# Patient Record
Sex: Female | Born: 1985 | Race: Black or African American | Hispanic: No | Marital: Single | Smoking: Never smoker
Health system: Southern US, Community
[De-identification: ages and names within clinical notes are randomized; demographics above are authoritative.]

---

## 2008-06-22 ENCOUNTER — Emergency Department (HOSPITAL_COMMUNITY): Admission: EM | Admit: 2008-06-22 | Discharge: 2008-06-22 | Payer: Self-pay | Admitting: Emergency Medicine

## 2008-07-13 ENCOUNTER — Emergency Department (HOSPITAL_COMMUNITY): Admission: EM | Admit: 2008-07-13 | Discharge: 2008-07-13 | Payer: Self-pay | Admitting: Emergency Medicine

## 2011-12-12 ENCOUNTER — Emergency Department (HOSPITAL_BASED_OUTPATIENT_CLINIC_OR_DEPARTMENT_OTHER)
Admission: EM | Admit: 2011-12-12 | Discharge: 2011-12-12 | Disposition: A | Discharge status: Home / Self Care | Payer: Self-pay | Attending: Emergency Medicine | Admitting: Emergency Medicine

## 2011-12-12 ENCOUNTER — Encounter (HOSPITAL_BASED_OUTPATIENT_CLINIC_OR_DEPARTMENT_OTHER): Payer: Self-pay

## 2011-12-12 DIAGNOSIS — K1379 Other lesions of oral mucosa: Secondary | ICD-10-CM

## 2011-12-12 DIAGNOSIS — K029 Dental caries, unspecified: Secondary | ICD-10-CM | POA: Insufficient documentation

## 2011-12-12 MED ORDER — HYDROCODONE-ACETAMINOPHEN 5-325 MG PO TABS: 2.0000 | ORAL_TABLET | ORAL | Status: AC | PRN

## 2011-12-12 MED ORDER — PENICILLIN V POTASSIUM 500 MG PO TABS: 500.0000 mg | ORAL_TABLET | Freq: Four times a day (QID) | ORAL | Status: AC

## 2011-12-12 NOTE — ED Notes (Signed)
Dental pain x 1 month unrelieved by Tylenol and Motrin

## 2011-12-12 NOTE — ED Provider Notes (Signed)
History     CSN: [id number]  Arrival date & time 12/12/11  8469   First MD Initiated Contact with Patient 12/12/11 1009      Chief Complaint  Patient presents with  . Dental Pain    HPI Dental pain x 1 month unrelieved by Tylenol and Motrin  History reviewed. No pertinent past medical history.  History reviewed. No pertinent past surgical history.  No family history on file.  History  Substance Use Topics  . Smoking status: Never Smoker   . Smokeless tobacco: Never Used  . Alcohol Use: No    OB History    Grav Para Term Preterm Abortions TAB SAB Ect Mult Living                  Review of Systems  All other systems reviewed and are negative.    Allergies  Review of patient's allergies indicates no known allergies.  Home Medications   Current Outpatient Rx  Name Route Sig Dispense Refill  . HYDROCODONE-ACETAMINOPHEN 5-325 MG PO TABS Oral Take 2 tablets by mouth every 4 (four) hours as needed for pain. 10 tablet 0  . PENICILLIN V POTASSIUM 500 MG PO TABS Oral Take 1 tablet (500 mg total) by mouth 4 (four) times daily. 40 tablet 0    BP 128/61  Pulse 80  Temp 98.8 F (37.1 C) (Oral)  Resp 18  Ht 5\' 8"  (1.727 m)  Wt 215 lb (97.523 kg)  BMI 32.69 kg/m2  SpO2 100%  LMP 11/28/2011  Physical Exam  Nursing note and vitals reviewed. Constitutional: She is oriented to person, place, and time. She appears well-developed. No distress.  HENT:  Head: Normocephalic and atraumatic.  Mouth/Throat:    Eyes: Pupils are equal, round, and reactive to light.  Neck: Normal range of motion.  Cardiovascular: Normal rate and intact distal pulses.   Pulmonary/Chest: No respiratory distress.  Abdominal: Normal appearance. She exhibits no distension.  Musculoskeletal: Normal range of motion.  Neurological: She is alert and oriented to person, place, and time. No cranial nerve deficit.  Skin: Skin is warm and dry. No rash noted.  Psychiatric: She has a normal mood and  affect. Her behavior is normal.    ED Course  Procedures (including critical care time)  Labs Reviewed - No data to display No results found.   1. Oral cavity pain       MDM         Nelia Shi, MD 12/12/11 1022

## 2012-05-06 ENCOUNTER — Emergency Department (HOSPITAL_BASED_OUTPATIENT_CLINIC_OR_DEPARTMENT_OTHER)
Admission: EM | Admit: 2012-05-06 | Discharge: 2012-05-06 | Disposition: A | Discharge status: Home / Self Care | Payer: Self-pay | Attending: Emergency Medicine | Admitting: Emergency Medicine

## 2012-05-06 ENCOUNTER — Emergency Department (HOSPITAL_BASED_OUTPATIENT_CLINIC_OR_DEPARTMENT_OTHER): Payer: Self-pay

## 2012-05-06 ENCOUNTER — Encounter (HOSPITAL_BASED_OUTPATIENT_CLINIC_OR_DEPARTMENT_OTHER): Payer: Self-pay | Admitting: *Deleted

## 2012-05-06 DIAGNOSIS — R1110 Vomiting, unspecified: Secondary | ICD-10-CM | POA: Insufficient documentation

## 2012-05-06 DIAGNOSIS — R05 Cough: Secondary | ICD-10-CM | POA: Insufficient documentation

## 2012-05-06 DIAGNOSIS — R059 Cough, unspecified: Secondary | ICD-10-CM | POA: Insufficient documentation

## 2012-05-06 DIAGNOSIS — R509 Fever, unspecified: Secondary | ICD-10-CM | POA: Insufficient documentation

## 2012-05-06 DIAGNOSIS — J029 Acute pharyngitis, unspecified: Secondary | ICD-10-CM | POA: Insufficient documentation

## 2012-05-06 DIAGNOSIS — IMO0001 Reserved for inherently not codable concepts without codable children: Secondary | ICD-10-CM | POA: Insufficient documentation

## 2012-05-06 DIAGNOSIS — J111 Influenza due to unidentified influenza virus with other respiratory manifestations: Secondary | ICD-10-CM

## 2012-05-06 DIAGNOSIS — R630 Anorexia: Secondary | ICD-10-CM | POA: Insufficient documentation

## 2012-05-06 DIAGNOSIS — R5381 Other malaise: Secondary | ICD-10-CM | POA: Insufficient documentation

## 2012-05-06 MED ORDER — BENZONATATE 100 MG PO CAPS: 100.0000 mg | ORAL_CAPSULE | Freq: Two times a day (BID) | ORAL | Status: DC | PRN

## 2012-05-06 NOTE — ED Notes (Signed)
Pt states several days ago she started having a cough and congestion states that yesterday and last night she coughed until she vomited. States she also has body aches and sore throat

## 2012-05-06 NOTE — ED Provider Notes (Signed)
History     CSN: [id number]  Arrival date & time 05/06/12  1048   First MD Initiated Contact with Patient 05/06/12 1048      Chief Complaint  Patient presents with  . Cough  . Generalized Body Aches    (Consider location/radiation/quality/duration/timing/severity/associated sxs/prior treatment) HPI Pt reports three days of dry cough, general malaise, myalgias, and subjective fever at home. Improved some with Nyquil taken last night. Symptoms associated with decreased appetite, sore throat and post-tussive emesis. Last vomited last night. Symptoms similar to previous episode of pneumonia.   History reviewed. No pertinent past medical history.  History reviewed. No pertinent past surgical history.  History reviewed. No pertinent family history.  History  Substance Use Topics  . Smoking status: Never Smoker   . Smokeless tobacco: Never Used  . Alcohol Use: No    OB History    Grav Para Term Preterm Abortions TAB SAB Ect Mult Living                  Review of Systems All other systems reviewed and are negative except as noted in HPI.   Allergies  Review of patient's allergies indicates no known allergies.  Home Medications  No current outpatient prescriptions on file.  BP 128/68  Pulse 87  Temp 100.4 F (38 C) (Oral)  Resp 20  Ht 5\' 8"  (1.727 m)  Wt 210 lb (95.255 kg)  BMI 31.93 kg/m2  SpO2 100%  LMP 05/05/2012  Physical Exam  Nursing note and vitals reviewed. Constitutional: She is oriented to person, place, and time. She appears well-developed and well-nourished.  HENT:  Head: Normocephalic and atraumatic.  Eyes: EOM are normal. Pupils are equal, round, and reactive to light.  Neck: Normal range of motion. Neck supple.  Cardiovascular: Normal rate, normal heart sounds and intact distal pulses.   Pulmonary/Chest: Effort normal and breath sounds normal.  Abdominal: Bowel sounds are normal. She exhibits no distension. There is no tenderness.    Musculoskeletal: Normal range of motion. She exhibits no edema and no tenderness.  Neurological: She is alert and oriented to person, place, and time. She has normal strength. No cranial nerve deficit or sensory deficit.  Skin: Skin is warm and dry. No rash noted.  Psychiatric: She has a normal mood and affect.    ED Course  Procedures (including critical care time)  Labs Reviewed - No data to display Dg Chest 2 View  05/06/2012  *RADIOLOGY REPORT*  Clinical Data: Cough and fever  CHEST - 2 VIEW  Comparison: None.  Findings: Lungs clear.  Heart size and pulmonary vascularity are normal.  No adenopathy.  No bone lesions.  IMPRESSION: No edema or consolidation.   Original Report Authenticated By: Bretta Bang, M.D.      No diagnosis found.    MDM  Pt febrile, but otherwise exam is unremarkable. Will check CXR given cough and previous pneumonia. Likely has influenza or other non-specific viral syndrome. She is beyond treatment window for anti-virals.          Charles B. Bernette Mayers, MD 05/06/12 1136

## 2016-08-01 ENCOUNTER — Encounter (HOSPITAL_COMMUNITY): Payer: Self-pay | Admitting: *Deleted

## 2016-08-01 ENCOUNTER — Emergency Department (HOSPITAL_COMMUNITY)
Admission: EM | Admit: 2016-08-01 | Discharge: 2016-08-01 | Disposition: A | Discharge status: Home / Self Care | Payer: Self-pay | Attending: Emergency Medicine | Admitting: Emergency Medicine

## 2016-08-01 DIAGNOSIS — J111 Influenza due to unidentified influenza virus with other respiratory manifestations: Secondary | ICD-10-CM | POA: Insufficient documentation

## 2016-08-01 DIAGNOSIS — R69 Illness, unspecified: Secondary | ICD-10-CM

## 2016-08-01 MED ORDER — OSELTAMIVIR PHOSPHATE 75 MG PO CAPS: 75.0000 mg | ORAL_CAPSULE | Freq: Two times a day (BID) | ORAL | 0 refills | Status: AC

## 2016-08-01 NOTE — ED Triage Notes (Signed)
The pt is c/o a cold body aches nose and throat irritation.  With intermittent sweats  lmp last  Month.  No chills she took theraflu 0800am today

## 2016-08-01 NOTE — ED Provider Notes (Signed)
[facility]-EMERGENCY DEPT Provider Note   CSN: [id number] Arrival date & time: 08/01/16  1509   By signing my name below, I, Clovis Pu, attest that this documentation has been prepared under the direction and in the presence of  [employer], PA-C. Electronically Signed: Clovis Pu, ED Scribe. 08/01/16. 4:57 PM.   History   Chief Complaint Chief Complaint  Patient presents with  . URI    HPI Comments:  Virginia Bruce is a 31 y.o. female who presents to the Emergency Department complaining of acute onset, "7/10" sore throat x today. Her pain is worse with swallowing. Pt also reports sinus pressure x yesterday, chills, generalized body aches, a cough, sneezing, rhinorrhea, congestion, a decrease in appetite secondary to pain and a frontal headache. She has taken Theraflu with mild relief. Pt denies ear pain, SOB, chest pain, abdominal pain, nausea, vomiting, diarrhea, any urinary symptoms, dizziness and any other associated symptoms. No other complaints noted.     The history is provided by the patient. No language interpreter was used.    History reviewed. No pertinent past medical history.  There are no active problems to display for this patient.   History reviewed. No pertinent surgical history.  OB History    No data available       Home Medications    Prior to Admission medications   Medication Sig Start Date End Date Taking? Authorizing Provider  benzonatate (TESSALON) 100 MG capsule Take 1 capsule (100 mg total) by mouth 2 (two) times daily as needed for cough. 05/06/12   Susy Frizzle, MD  oseltamivir (TAMIFLU) 75 MG capsule Take 1 capsule (75 mg total) by mouth 2 (two) times daily. 08/01/16 08/06/16  Jeanie Sewer, PA-C    Family History No family history on file.  Social History Social History  Substance Use Topics  . Smoking status: Never Smoker  . Smokeless tobacco: Never Used  . Alcohol use No     Allergies   Patient has no known allergies.   Review of  Systems Review of Systems  Constitutional: Positive for appetite change (secondary to pain ) and chills. Negative for fever.  HENT: Positive for congestion, rhinorrhea, sinus pressure, sneezing and sore throat. Negative for ear pain, hearing loss and trouble swallowing.   Eyes: Negative for visual disturbance.  Respiratory: Positive for cough. Negative for shortness of breath.   Cardiovascular: Negative for chest pain.  Gastrointestinal: Negative for abdominal pain, constipation, nausea and vomiting.  Genitourinary: Negative for dysuria and hematuria.  Musculoskeletal: Positive for myalgias.  Neurological: Positive for headaches. Negative for dizziness.  Hematological: Negative for adenopathy.     Physical Exam Updated Vital Signs BP 137/68 (BP Location: Left Arm)   Pulse 83   Temp 98 F (36.7 C) (Oral)   Resp 18   Ht  (1.727 m)   Wt 127.5 kg   LMP 07/01/2016   SpO2 99%   BMI 42.73 kg/m   Physical Exam  Constitutional: She is oriented to person, place, and time. She appears well-developed and well-nourished. No distress.  HENT:  Head: Normocephalic and atraumatic.  Right Ear: External ear normal.  Left Ear: External ear normal.  Mouth/Throat: Oropharynx is clear and moist. No oropharyngeal exudate.  TMs normal bl, throat mildly erythematous with no tonsilla swelling, exudates, or uvular deviation. Ttp of frontal sinuses, no ttp of maxillary sinuses. Nasal septum mildly irritated with clear mucosa.   Eyes: Conjunctivae and EOM are normal. Pupils are equal, round, and reactive to light.  Right eye exhibits no discharge. Left eye exhibits no discharge. No scleral icterus.  Neck: Normal range of motion. Neck supple. No JVD present. No thyromegaly present.  Cardiovascular: Normal rate, regular rhythm, normal heart sounds and intact distal pulses.   Pulmonary/Chest: Effort normal and breath sounds normal.  Abdominal: Soft. Bowel sounds are normal. She exhibits no distension.  There is no tenderness.  Musculoskeletal: Normal range of motion.  Lymphadenopathy:    She has no cervical adenopathy.  Neurological: She is alert and oriented to person, place, and time.  Skin: Skin is warm and dry. Capillary refill takes less than 2 seconds. No rash noted. She is not diaphoretic. No erythema.  Psychiatric: She has a normal mood and affect. Her behavior is normal. Judgment and thought content normal.     ED Treatments / Results  DIAGNOSTIC STUDIES:  Oxygen Saturation is 99% on RA, normal by my interpretation.    COORDINATION OF CARE:  4:57 PM Discussed treatment plan with pt at bedside and pt agreed to plan.  Labs (all labs ordered are listed, but only abnormal results are displayed) Labs Reviewed - No data to display  EKG  EKG Interpretation None       Radiology No results found.  Procedures Procedures (including critical care time)  Medications Ordered in ED Medications - No data to display   Initial Impression / Assessment and Plan / ED Course  I have reviewed the triage vital signs and the nursing notes.  Pertinent labs & imaging results that were available during my care of the patient were reviewed by me and considered in my medical decision making (see chart for details).     Patient complaining of symptoms of sinusitis.    30yof presents to ED with 2 day hx sinus pressure with associated HA, sore throat, and generalized body aches. Pt afebrile, mildly hypertensive but otherwise VSS. Suspect elevated BP due to Theraflu which contains pseudoephedrine; counseled pt to f/u with PCP for re-evaluation of BP.  Low suspicion strep pharyngitis given presence of cough, lack of LAD and tonsillar exudates/swelling and minimally erythematous pharynx. Mild to moderate symptoms of clear/yellow nasal discharge/congestion and scratchy throat with cough for less than 10 days. No concern for acute bacterial rhinosinusitis; likely viral in nature. With  associated myalgias possibly flu - discussed with pt who would like rx for tamiflu "just in case". I think this is reasonable given duration of symptoms and presence of myalgias. Instructed pt on how to take as well as adverse effects. Pt verbalized understanding. Also discussed symptomatic treatment including nasal saline washes, warm tea and honey. Recommendations for follow-up with primary care physician.  On re-evaluation BP improved. ED return precautions given; pt stable for discharge home at this time.    Final Clinical Impressions(s) / ED Diagnoses   Final diagnoses:  Influenza-like illness    New Prescriptions New Prescriptions   OSELTAMIVIR (TAMIFLU) 75 MG CAPSULE    Take 1 capsule (75 mg total) by mouth 2 (two) times daily.  I personally performed the services described in this documentation, which was scribed in my presence. The recorded information has been reviewed and is accurate.    Jeanie Sewer, PA-C 08/01/16 1732    Nira Conn, MD 08/01/16 2118

## 2016-09-21 ENCOUNTER — Emergency Department (HOSPITAL_COMMUNITY)
Admission: EM | Admit: 2016-09-21 | Discharge: 2016-09-21 | Disposition: A | Discharge status: Home / Self Care | Payer: Self-pay | Attending: Emergency Medicine | Admitting: Emergency Medicine

## 2016-09-21 ENCOUNTER — Encounter (HOSPITAL_COMMUNITY): Payer: Self-pay | Admitting: Emergency Medicine

## 2016-09-21 DIAGNOSIS — R21 Rash and other nonspecific skin eruption: Secondary | ICD-10-CM | POA: Insufficient documentation

## 2016-09-21 MED ORDER — PERMETHRIN 5 % EX CREA: TOPICAL_CREAM | CUTANEOUS | 0 refills | Status: DC

## 2016-09-21 MED ORDER — HYDROCORTISONE 1 % EX CREA: TOPICAL_CREAM | CUTANEOUS | 0 refills | Status: DC

## 2016-09-21 NOTE — ED Triage Notes (Signed)
Patient c/o itching rash with "small red bumps" to right arm yesterday. Reports sx have resolved. Denies pain.

## 2016-09-21 NOTE — ED Provider Notes (Signed)
[facility]-EMERGENCY DEPT Provider Note    By signing my name below, I, Earmon Phoenix, attest that this documentation has been prepared under the direction and in the presence of [employer], PA-C. Electronically Signed: Earmon Phoenix, ED Scribe. 09/21/16. 5:09 PM.    History   Chief Complaint Chief Complaint  Patient presents with  . Rash   The history is provided by the patient and medical records. No language interpreter was used.    Virginia Bruce is an obese 31 y.o. female who presents to the Emergency Department complaining of a rash to BUE, breasts and abdomen that appeared yesterday morning upon waking. She reports associated itching of the areas. She states her fiance has similar symptoms as well. 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 She denies any new lotions, soaps, creams or other personal care items. She has not done anything to treat her symptoms. There are no modifying factors noted. She denies fever, chills, nausea, vomiting, difficulty breathing or swallowing.   History reviewed. No pertinent past medical history.  There are no active problems to display for this patient.   History reviewed. No pertinent surgical history.  OB History    No data available       Home Medications    Prior to Admission medications   Medication Sig Start Date End Date Taking? Authorizing Provider  benzonatate (TESSALON) 100 MG capsule Take 1 capsule (100 mg total) by mouth 2 (two) times daily as needed for cough. 05/06/12   Susy Frizzle, MD  hydrocortisone cream 1 % Apply to affected area 2 times daily 09/21/16   Claxton Levitz, Tinnie Gens, PA-C  permethrin (ELIMITE) 5 % cream Apply to affected area once 09/21/16   Eyvonne Mechanic, PA-C    Family History History reviewed. No pertinent family history.  Social History Social History  Substance Use Topics  . Smoking status: Never Smoker  . Smokeless tobacco: Never Used  . Alcohol use No     Allergies   Patient has  no known allergies.   Review of Systems Review of Systems  Constitutional: Negative for chills and fever.  HENT: Negative for trouble swallowing.   Respiratory: Negative for shortness of breath.   Gastrointestinal: Negative for nausea and vomiting.  Skin: Positive for rash.     Physical Exam Updated Vital Signs BP 131/60 (BP Location: Left Arm)   Pulse 81   Temp 98.4 F (36.9 C)   Resp 18   LMP 08/31/2016   SpO2 100%   Physical Exam  Constitutional: She is oriented to person, place, and time. She appears well-developed and well-nourished.  HENT:  Head: Normocephalic and atraumatic.  Neck: Normal range of motion.  Cardiovascular: Normal rate.   Pulmonary/Chest: Effort normal.  Musculoskeletal: Normal range of motion.  Neurological: She is alert and oriented to person, place, and time.  Skin: Skin is warm and dry. Rash noted.  Minor redness to elbows with no vesicles, bumps or lesions. Remainder of skin normal.  Psychiatric: She has a normal mood and affect. Her behavior is normal.  Nursing note and vitals reviewed.    ED Treatments / Results  DIAGNOSTIC STUDIES: Oxygen Saturation is 100% on RA, normal by my interpretation.   COORDINATION OF CARE: 5:04 PM- Will prescribe Permethrin cream. Instructed pt to follow up with PCP for continued symptoms. Will provide work note. Pt verbalizes understanding and agrees to plan.  Medications - No data to display  Labs (all labs ordered are listed, but only abnormal results are displayed)  Labs Reviewed - No data to display  EKG  EKG Interpretation None       Radiology No results found.  Procedures Procedures (including critical care time)  Medications Ordered in ED Medications - No data to display   Initial Impression / Assessment and Plan / ED Course  I have reviewed the triage vital signs and the nursing notes.  Pertinent labs & imaging results that were available during my care of the patient were reviewed  by me and considered in my medical decision making (see chart for details).       Final Clinical Impressions(s) / ED Diagnoses   Final diagnoses:  Rash    New Prescriptions Discharge Medication List as of 09/21/2016  5:12 PM    START taking these medications   Details  hydrocortisone cream 1 % Apply to affected area 2 times daily, Print    permethrin (ELIMITE) 5 % cream Apply to affected area once, Print        Labs:   Imaging:  Consults:  Therapeutics:   Discharge Meds: Permethrin, hydrocortisone  Assessment/Plan: 31 year old female presents today with rash and itching to her elbows. I saw this patient along with her partner. Her symptoms are not very dramatic, aside from her intense pruritus. Patient on her exam and her partner which is more pronounced with rash I have concern for scabies. It is less likely that this is a contact related exposure given both patients woke up with slightly different presentations but with intense itching. I would treat both of them with permethrin, hydrocortisone as needed for itching thereafter and primary care follow-up. Patient is instructed to wash all close, bedding, known exposures. Primary care follow-up, return precautions given. She verbalized understanding and agreement to today's plan  I personally performed the services described in this documentation, which was scribed in my presence. The recorded information has been reviewed and is accurate.   Eyvonne MechanicHedges, Brentton Wardlow, PA-C 09/21/16 1722    Gwyneth SproutPlunkett, Whitney, MD 09/21/16 [phone]

## 2016-09-21 NOTE — Discharge Instructions (Signed)
Please read attached information. If you experience any new or worsening signs or symptoms please return to the emergency room for evaluation. Please follow-up with your primary care provider or specialist as discussed. Please use medication prescribed only as directed and discontinue taking if you have any concerning signs or symptoms.   °

## 2017-02-18 ENCOUNTER — Emergency Department (HOSPITAL_BASED_OUTPATIENT_CLINIC_OR_DEPARTMENT_OTHER)
Admission: EM | Admit: 2017-02-18 | Discharge: 2017-02-18 | Disposition: A | Discharge status: Home / Self Care | Payer: Self-pay | Attending: Emergency Medicine | Admitting: Emergency Medicine

## 2017-02-18 ENCOUNTER — Encounter (HOSPITAL_BASED_OUTPATIENT_CLINIC_OR_DEPARTMENT_OTHER): Payer: Self-pay | Admitting: *Deleted

## 2017-02-18 DIAGNOSIS — K279 Peptic ulcer, site unspecified, unspecified as acute or chronic, without hemorrhage or perforation: Secondary | ICD-10-CM | POA: Insufficient documentation

## 2017-02-18 DIAGNOSIS — D509 Iron deficiency anemia, unspecified: Secondary | ICD-10-CM | POA: Insufficient documentation

## 2017-02-18 LAB — URINALYSIS, ROUTINE W REFLEX MICROSCOPIC
BILIRUBIN URINE: NEGATIVE
Glucose, UA: NEGATIVE mg/dL
Hgb urine dipstick: NEGATIVE
KETONES UR: 15 mg/dL — AB
LEUKOCYTES UA: NEGATIVE
NITRITE: NEGATIVE
PROTEIN: NEGATIVE mg/dL
Specific Gravity, Urine: 1.02 (ref 1.005–1.030)
pH: 7.5 (ref 5.0–8.0)

## 2017-02-18 LAB — CBC WITH DIFFERENTIAL/PLATELET
BASOS ABS: 0 10*3/uL (ref 0.0–0.1)
BASOS PCT: 0 %
EOS ABS: 0.1 10*3/uL (ref 0.0–0.7)
Eosinophils Relative: 2 %
HEMATOCRIT: 26.3 % — AB (ref 36.0–46.0)
HEMOGLOBIN: 7.4 g/dL — AB (ref 12.0–15.0)
Lymphocytes Relative: 14 %
Lymphs Abs: 1.2 10*3/uL (ref 0.7–4.0)
MCH: 21.3 pg — ABNORMAL LOW (ref 26.0–34.0)
MCHC: 28.1 g/dL — AB (ref 30.0–36.0)
MCV: 75.6 fL — ABNORMAL LOW (ref 78.0–100.0)
MONO ABS: 0.5 10*3/uL (ref 0.1–1.0)
MONOS PCT: 6 %
NEUTROS ABS: 6.5 10*3/uL (ref 1.7–7.7)
NEUTROS PCT: 78 %
Platelets: 447 10*3/uL — ABNORMAL HIGH (ref 150–400)
RBC: 3.48 MIL/uL — ABNORMAL LOW (ref 3.87–5.11)
RDW: 17 % — AB (ref 11.5–15.5)
WBC: 8.2 10*3/uL (ref 4.0–10.5)

## 2017-02-18 LAB — COMPREHENSIVE METABOLIC PANEL
ALBUMIN: 3.8 g/dL (ref 3.5–5.0)
ALT: 80 U/L — ABNORMAL HIGH (ref 14–54)
ANION GAP: 8 (ref 5–15)
AST: 172 U/L — AB (ref 15–41)
Alkaline Phosphatase: 86 U/L (ref 38–126)
BILIRUBIN TOTAL: 1.2 mg/dL (ref 0.3–1.2)
BUN: 10 mg/dL (ref 6–20)
CHLORIDE: 106 mmol/L (ref 101–111)
CO2: 24 mmol/L (ref 22–32)
Calcium: 8.9 mg/dL (ref 8.9–10.3)
Creatinine, Ser: 0.57 mg/dL (ref 0.44–1.00)
GFR calc Af Amer: >60 mL/min (ref >60)
GFR calc non Af Amer: >60 mL/min (ref >60)
GLUCOSE: 76 mg/dL (ref 65–99)
POTASSIUM: 3.4 mmol/L — AB (ref 3.5–5.1)
SODIUM: 138 mmol/L (ref 135–145)
TOTAL PROTEIN: 7.4 g/dL (ref 6.5–8.1)

## 2017-02-18 LAB — LIPASE, BLOOD: Lipase: 22 U/L (ref 11–51)

## 2017-02-18 LAB — PREGNANCY, URINE: PREG TEST UR: NEGATIVE

## 2017-02-18 MED ORDER — FERROUS SULFATE 325 (65 FE) MG PO TABS: 325.0000 mg | ORAL_TABLET | Freq: Every day | ORAL | 0 refills | Status: DC

## 2017-02-18 MED ORDER — OMEPRAZOLE 20 MG PO CPDR: 20.0000 mg | DELAYED_RELEASE_CAPSULE | Freq: Every day | ORAL | 0 refills | Status: DC

## 2017-02-18 NOTE — ED Provider Notes (Signed)
[facility] EMERGENCY DEPARTMENT Provider Note   CSN: [id number] Arrival date & time: 02/18/17  1443     History   Chief Complaint Chief Complaint  Patient presents with  . Abdominal Pain  . Back Pain    HPI Virginia Bruce is a 31 y.o. female.  HPI Burning pain in upper abdomen for one month.  Usually it would come and go.  Most often early in the am.  It would get better after eating.    Sometimes tums would help.  She would often feel better after vomiting.  Today the sx were more severe and did not resolve so she came to the ED. History reviewed. No pertinent past medical history.  There are no active problems to display for this patient.   History reviewed. No pertinent surgical history.  OB History    No data available       Home Medications    Prior to Admission medications   Medication Sig Start Date End Date Taking? Authorizing Provider  benzonatate (TESSALON) 100 MG capsule Take 1 capsule (100 mg total) by mouth 2 (two) times daily as needed for cough. 05/06/12   Susy FrizzleSheldon, Charles, MD  ferrous sulfate 325 (65 FE) MG tablet Take 1 tablet (325 mg total) by mouth daily. 02/18/17   Linwood DibblesKnapp, Hobert Poplaski, MD  hydrocortisone cream 1 % Apply to affected area 2 times daily 09/21/16   Hedges, Tinnie GensJeffrey, PA-C  omeprazole (PRILOSEC) 20 MG capsule Take 1 capsule (20 mg total) by mouth daily. 02/18/17   Linwood DibblesKnapp, Teran Knittle, MD  permethrin Verner Mould(ELIMITE) 5 % cream Apply to affected area once 09/21/16   Hedges, Tinnie GensJeffrey, PA-C    Family History No family history on file.  Social History Social History  Substance Use Topics  . Smoking status: Never Smoker  . Smokeless tobacco: Never Used  . Alcohol use No     Allergies   Patient has no known allergies.   Review of Systems Review of Systems  Constitutional: Negative for fever.  Gastrointestinal: Positive for vomiting. Negative for blood in stool and diarrhea.  Genitourinary: Negative for dysuria.  Musculoskeletal: Negative for back  pain.  All other systems reviewed and are negative.    Physical Exam Updated Vital Signs BP 125/72 (BP Location: Right Arm)   Pulse 64   Temp 99 F (37.2 C) (Oral)   Resp 18   Ht 1.727 m (5\' 8" )   Wt 122.5 kg (270 lb)   LMP 02/04/2017   SpO2 100%   BMI 41.05 kg/m   Physical Exam  Constitutional: She appears well-developed and well-nourished. No distress.  HENT:  Head: Normocephalic and atraumatic.  Right Ear: External ear normal.  Left Ear: External ear normal.  Eyes: Conjunctivae are normal. Right eye exhibits no discharge. Left eye exhibits no discharge. No scleral icterus.  Neck: Neck supple. No tracheal deviation present.  Cardiovascular: Normal rate, regular rhythm and intact distal pulses.   Pulmonary/Chest: Effort normal and breath sounds normal. No stridor. No respiratory distress. She has no wheezes. She has no rales.  Abdominal: Soft. Bowel sounds are normal. She exhibits no distension. There is tenderness in the epigastric area. There is no rebound and no guarding.  Musculoskeletal: She exhibits no edema or tenderness.  Neurological: She is alert. She has normal strength. No cranial nerve deficit (no facial droop, extraocular movements intact, no slurred speech) or sensory deficit. She exhibits normal muscle tone. She displays no seizure activity. Coordination normal.  Skin: Skin is warm and dry.  No rash noted.  Psychiatric: She has a normal mood and affect.  Nursing note and vitals reviewed.    ED Treatments / Results  Labs (all labs ordered are listed, but only abnormal results are displayed) Labs Reviewed  URINALYSIS, ROUTINE W REFLEX MICROSCOPIC - Abnormal; Notable for the following:       Result Value   Ketones, ur 15 (*)    All other components within normal limits  CBC WITH DIFFERENTIAL/PLATELET - Abnormal; Notable for the following:    RBC 3.48 (*)    Hemoglobin 7.4 (*)    HCT 26.3 (*)    MCV 75.6 (*)    MCH 21.3 (*)    MCHC 28.1 (*)    RDW 17.0  (*)    Platelets 447 (*)    All other components within normal limits  COMPREHENSIVE METABOLIC PANEL - Abnormal; Notable for the following:    Potassium 3.4 (*)    AST 172 (*)    ALT 80 (*)    All other components within normal limits  PREGNANCY, URINE  LIPASE, BLOOD     Radiology No results found.  Procedures Procedures (including critical care time)  Medications Ordered in ED Medications - No data to display   Initial Impression / Assessment and Plan / ED Course  I have reviewed the triage vital signs and the nursing notes.  Pertinent labs & imaging results that were available during my care of the patient were reviewed by me and considered in my medical decision making (see chart for details).   Discussed the laboratory tests with the patient.  Patient states she does have a history of anemia.  She is not sure what her hemoglobin normally runs.  She does not take any iron supplements.  Patient denies any blood in her stool or dark stools.  She does have heavy menstrual periods.  Low MCV does suggest an iron deficiency anemia.  I will have the patient start on iron supplements.  I discussed the importance of outpatient follow-up with a primary care doctor.  Her abdominal pain sounds like peptic ulcer disease.  She has a mild increase in her LFTs but I do not think that is clinically significant.  I will start her on antacids.  Discussed the importance of outpatient follow-up  Final Clinical Impressions(s) / ED Diagnoses   Final diagnoses:  Microcytic anemia  PUD (peptic ulcer disease)    New Prescriptions New Prescriptions   FERROUS SULFATE 325 (65 FE) MG TABLET    Take 1 tablet (325 mg total) by mouth daily.   OMEPRAZOLE (PRILOSEC) 20 MG CAPSULE    Take 1 capsule (20 mg total) by mouth daily.     Linwood Dibbles, MD 02/18/17 [phone]

## 2017-02-18 NOTE — Discharge Instructions (Signed)
Follow-up with a primary care doctor, take the iron tablets as described, take antacids to see if that helps with your stomach problems.  Return for fever, blood in your  stools or other concerning symptoms

## 2017-02-18 NOTE — ED Triage Notes (Signed)
Abdominal and back pain. Burning sensation. Nausea.

## 2017-10-28 ENCOUNTER — Encounter (HOSPITAL_BASED_OUTPATIENT_CLINIC_OR_DEPARTMENT_OTHER): Payer: Self-pay | Admitting: Adult Health

## 2017-10-28 ENCOUNTER — Emergency Department (HOSPITAL_BASED_OUTPATIENT_CLINIC_OR_DEPARTMENT_OTHER)
Admission: EM | Admit: 2017-10-28 | Discharge: 2017-10-29 | Disposition: A | Discharge status: Home / Self Care | Payer: Self-pay | Attending: Emergency Medicine | Admitting: Emergency Medicine

## 2017-10-28 ENCOUNTER — Other Ambulatory Visit: Payer: Self-pay

## 2017-10-28 DIAGNOSIS — Z79899 Other long term (current) drug therapy: Secondary | ICD-10-CM | POA: Insufficient documentation

## 2017-10-28 DIAGNOSIS — L03012 Cellulitis of left finger: Secondary | ICD-10-CM | POA: Insufficient documentation

## 2017-10-28 MED ORDER — TRAMADOL HCL 50 MG PO TABS
50.0000 mg | ORAL_TABLET | Freq: Once | ORAL | Status: AC
  Administered 2017-10-29: 50 mg via ORAL
  Filled 2017-10-28: qty 1

## 2017-10-28 MED ORDER — CEPHALEXIN 500 MG PO CAPS: 500.0000 mg | ORAL_CAPSULE | Freq: Four times a day (QID) | ORAL | 0 refills | Status: DC

## 2017-10-28 MED ORDER — TRAMADOL HCL 50 MG PO TABS: 50.0000 mg | ORAL_TABLET | Freq: Four times a day (QID) | ORAL | 0 refills | Status: DC | PRN

## 2017-10-28 NOTE — Discharge Instructions (Addendum)
Keflex as prescribed.  Tramadol as prescribed as needed for pain.  Apply warm soaks as frequently as possible for the next several days.  Follow-up with your primary doctor if symptoms are not improving in the next 3 to 4 days.

## 2017-10-28 NOTE — ED Provider Notes (Signed)
[facility] EMERGENCY DEPARTMENT Provider Note   CSN: [id number] Arrival date & time: 10/28/17  2256     History   Chief Complaint Chief Complaint  Patient presents with  . Hand Pain    HPI Argentina Frechette is a 32 y.o. female.  Patient is a 32 year old female with no significant past medical history.  She presents with left index finger pain.  This is been ongoing for the past 2 weeks and worsening.  She denies any injury or trauma.  She denies any fevers or chills.  The history is provided by the patient.  Hand Pain  This is a new problem. Episode onset: 2 weeks ago. The problem occurs constantly. The problem has been gradually worsening. Nothing aggravates the symptoms. Nothing relieves the symptoms.    History reviewed. No pertinent past medical history.  There are no active problems to display for this patient.   History reviewed. No pertinent surgical history.   OB History   None      Home Medications    Prior to Admission medications   Medication Sig Start Date End Date Taking? Authorizing Provider  benzonatate (TESSALON) 100 MG capsule Take 1 capsule (100 mg total) by mouth 2 (two) times daily as needed for cough. 05/06/12   Susy Frizzle, MD  ferrous sulfate 325 (65 FE) MG tablet Take 1 tablet (325 mg total) by mouth daily. 02/18/17   Linwood Dibbles, MD  hydrocortisone cream 1 % Apply to affected area 2 times daily 09/21/16   Hedges, Tinnie Gens, PA-C  omeprazole (PRILOSEC) 20 MG capsule Take 1 capsule (20 mg total) by mouth daily. 02/18/17   Linwood Dibbles, MD  permethrin Verner Mould) 5 % cream Apply to affected area once 09/21/16   Hedges, Tinnie Gens, PA-C    Family History History reviewed. No pertinent family history.  Social History Social History   Tobacco Use  . Smoking status: Never Smoker  . Smokeless tobacco: Never Used  Substance Use Topics  . Alcohol use: No  . Drug use: No     Allergies   Patient has no known allergies.   Review of  Systems Review of Systems  All other systems reviewed and are negative.    Physical Exam Updated Vital Signs BP 126/74 (BP Location: Left Arm)   Pulse 86   Temp 98.1 F (36.7 C) (Oral)   Resp 20   Ht 5\' 8"  (1.727 m)   Wt 122.5 kg (270 lb)   LMP 10/21/2017 (Approximate)   SpO2 100%   BMI 41.05 kg/m   Physical Exam  Constitutional: She is oriented to person, place, and time. She appears well-developed and well-nourished. No distress.  HENT:  Head: Normocephalic and atraumatic.  Neck: Normal range of motion. Neck supple.  Pulmonary/Chest: Effort normal.  Neurological: She is alert and oriented to person, place, and time.  Skin: Skin is warm and dry. She is not diaphoretic.  The left index finger has a paronychia noted.  Nursing note and vitals reviewed.    ED Treatments / Results  Labs (all labs ordered are listed, but only abnormal results are displayed) Labs Reviewed - No data to display  EKG None  Radiology No results found.  Procedures Procedures (including critical care time)  Medications Ordered in ED Medications  traMADol (ULTRAM) tablet 50 mg (has no administration in time range)     Initial Impression / Assessment and Plan / ED Course  I have reviewed the triage vital signs and the nursing notes.  Pertinent  labs & imaging results that were available during my care of the patient were reviewed by me and considered in my medical decision making (see chart for details).  INCISION AND DRAINAGE Performed by: Geoffery Lyonsouglas Jamarques Pinedo Consent: Verbal consent obtained. Risks and benefits: risks, benefits and alternatives were discussed Type: abscess  Body area: left index finger  Anesthesia: local infiltration  Incision was made with a scalpel.  Local anesthetic: none used  Anesthetic total: 0 ml  Complexity: complex Blunt dissection to break up loculations  Drainage: purulent  Drainage amount: moderate  Packing material: none  Patient tolerance:  Patient tolerated the procedure well with no immediate complications.  Paronychia drained.  Patient tolerated the procedure well.  She will be discharged with Keflex and tramadol.  To follow-up as needed for any problems.   Final Clinical Impressions(s) / ED Diagnoses   Final diagnoses:  None    ED Discharge Orders    None       Geoffery Lyonselo, Macarena Langseth, MD 10/28/17 2345

## 2017-10-28 NOTE — ED Triage Notes (Signed)
Presents with a paronychia to left index finger for the past 2 weeks.

## 2018-01-05 ENCOUNTER — Encounter (HOSPITAL_COMMUNITY): Payer: Self-pay

## 2018-01-05 ENCOUNTER — Ambulatory Visit (INDEPENDENT_AMBULATORY_CARE_PROVIDER_SITE_OTHER): Payer: Self-pay

## 2018-01-05 ENCOUNTER — Ambulatory Visit (HOSPITAL_COMMUNITY)
Admission: EM | Admit: 2018-01-05 | Discharge: 2018-01-05 | Disposition: A | Discharge status: Home / Self Care | Payer: Self-pay | Attending: Internal Medicine | Admitting: Internal Medicine

## 2018-01-05 DIAGNOSIS — J22 Unspecified acute lower respiratory infection: Secondary | ICD-10-CM

## 2018-01-05 MED ORDER — AZITHROMYCIN 250 MG PO TABS: ORAL_TABLET | ORAL | 0 refills | Status: AC

## 2018-01-05 MED ORDER — ACETAMINOPHEN 325 MG PO TABS
650.0000 mg | ORAL_TABLET | Freq: Once | ORAL | Status: AC
  Administered 2018-01-05: 650 mg via ORAL

## 2018-01-05 MED ORDER — GUAIFENESIN ER 600 MG PO TB12: 1200.0000 mg | ORAL_TABLET | Freq: Two times a day (BID) | ORAL | 0 refills | Status: AC | PRN

## 2018-01-05 MED ORDER — ALBUTEROL SULFATE HFA 108 (90 BASE) MCG/ACT IN AERS: 1.0000 | INHALATION_SPRAY | Freq: Four times a day (QID) | RESPIRATORY_TRACT | 0 refills | Status: AC | PRN

## 2018-01-05 NOTE — Discharge Instructions (Signed)
Push fluids to ensure adequate hydration and keep secretions thin.  Tylenol and/or ibuprofen as needed for pain or fevers.   Inhaler every 4-6 hours as needed for cough or shortness of breath .  Complete course of antibiotics.  Use of mucinex as an expectorant

## 2018-01-05 NOTE — ED Triage Notes (Signed)
Pt presents with cold/ flu symptoms; coughing up mucus, chills, ongoing fever, body aches, and headaches.

## 2018-01-05 NOTE — ED Provider Notes (Signed)
[facility]    CSN: [id number] Arrival date & time: 01/05/18  1358     History   Chief Complaint Chief Complaint  Patient presents with  . Cold / Flu symptoms    HPI Virginia Bruce is a 32 y.o. female.   Virginia Bruce presents with complaints of dry cough, body aches which started two days ago. No known fevers but with fever on arrival. Feels worse. Abdominal muscles sore from coughing. Speaking can even illicit cough. Had diarrhea this morning. Otherwise has been able to eat and drink. Took dayquil and nyquil yesterday which did not help. Has not taken any medications today for symptoms. No rash. No history of asthma. No ear pain. Some sore throat with coughing. Denies chest pain  Or shortness of breath. Smokes black and mild occasionally. Without contributing medical history.      ROS per HPI.      History reviewed. No pertinent past medical history.  There are no active problems to display for this patient.   History reviewed. No pertinent surgical history.  OB History   None      Home Medications    Prior to Admission medications   Medication Sig Start Date End Date Taking? Authorizing Provider  albuterol (PROAIR HFA) 108 (90 Base) MCG/ACT inhaler Inhale 1-2 puffs into the lungs every 6 (six) hours as needed for wheezing or shortness of breath. 01/05/18   Georgetta Haber, NP  azithromycin (ZITHROMAX) 250 MG tablet Take 2 tablets (500 mg total) by mouth daily for 1 day, THEN 1 tablet (250 mg total) daily for 4 days. 01/05/18 01/10/18  Georgetta Haber, NP  guaiFENesin (MUCINEX) 600 MG 12 hr tablet Take 2 tablets (1,200 mg total) by mouth 2 (two) times daily as needed for up to 5 days. 01/05/18 01/10/18  Georgetta Haber, NP    Family History History reviewed. No pertinent family history.  Social History Social History   Tobacco Use  . Smoking status: Never Smoker  . Smokeless tobacco: Never Used  Substance Use Topics  . Alcohol use: No  . Drug use: No      Allergies   Patient has no known allergies.   Review of Systems Review of Systems   Physical Exam Triage Vital Signs ED Triage Vitals [01/05/18 1423]  Enc Vitals Group     BP (!) 142/82     Pulse Rate 89     Resp 20     Temp 100.3 F (37.9 C)     Temp Source Oral     SpO2 100 %     Weight      Height      Head Circumference      Peak Flow      Pain Score      Pain Loc      Pain Edu?      Excl. in GC?    No data found.  Updated Vital Signs BP (!) 142/82 (BP Location: Right Arm)   Pulse 89   Temp 100.3 F (37.9 C) (Oral)   Resp 20   LMP 01/02/2018   SpO2 100%    Physical Exam  Constitutional: She is oriented to person, place, and time. She appears well-developed and well-nourished. No distress.  HENT:  Head: Normocephalic and atraumatic.  Right Ear: Tympanic membrane, external ear and ear canal normal.  Left Ear: Tympanic membrane, external ear and ear canal normal.  Nose: Nose normal.  Mouth/Throat: Uvula is midline, oropharynx is clear  and moist and mucous membranes are normal. No tonsillar exudate.  Eyes: Pupils are equal, round, and reactive to light. Conjunctivae and EOM are normal.  Cardiovascular: Normal rate, regular rhythm and normal heart sounds.  Pulmonary/Chest: Effort normal. No tachypnea. She has decreased breath sounds in the right lower field. She has no wheezes.  Occasional strong dry cough noted; illicited with deep breathing.   Neurological: She is alert and oriented to person, place, and time.  Skin: Skin is warm and dry.     UC Treatments / Results  Labs (all labs ordered are listed, but only abnormal results are displayed) Labs Reviewed - No data to display  EKG None  Radiology Dg Chest 2 View  Result Date: 01/05/2018 CLINICAL DATA:  Cough and shortness of breath for the past 3 days with some fever. Smoker. EXAM: CHEST - 2 VIEW COMPARISON:  05/06/2012. FINDINGS: Normal sized heart. Clear lungs. Minimal peribronchial  thickening. Normal appearing bones. IMPRESSION: Minimal bronchitic changes. Electronically Signed   By: Beckie Salts M.D.   On: 01/05/2018 15:20    Procedures Procedures (including critical care time)  Medications Ordered in UC Medications  acetaminophen (TYLENOL) tablet 650 mg (650 mg Oral Given 01/05/18 1437)    Initial Impression / Assessment and Plan / UC Course  I have reviewed the triage vital signs and the nursing notes.  Pertinent labs & imaging results that were available during my care of the patient were reviewed by me and considered in my medical decision making (see chart for details).     Chest xray reassuring today. Febrile in clinic today with persistent cough, ill exposures at home. Will cover empirically with azithromycin. Use of inhaler as needed. Continue with supportive cares. Return precautions provided. If symptoms worsen or do not improve in the next week to return to be seen or to follow up with PCP.  Patient verbalized understanding and agreeable to plan.    Final Clinical Impressions(s) / UC Diagnoses   Final diagnoses:  Lower respiratory tract infection     Discharge Instructions     Push fluids to ensure adequate hydration and keep secretions thin.  Tylenol and/or ibuprofen as needed for pain or fevers.   Inhaler every 4-6 hours as needed for cough or shortness of breath .  Complete course of antibiotics.  Use of mucinex as an expectorant    ED Prescriptions    Medication Sig Dispense Auth. Provider   albuterol (PROAIR HFA) 108 (90 Base) MCG/ACT inhaler Inhale 1-2 puffs into the lungs every 6 (six) hours as needed for wheezing or shortness of breath. 1 Inhaler Burky, Dorene Grebe B, NP   azithromycin (ZITHROMAX) 250 MG tablet Take 2 tablets (500 mg total) by mouth daily for 1 day, THEN 1 tablet (250 mg total) daily for 4 days. 6 tablet Linus Mako B, NP   guaiFENesin (MUCINEX) 600 MG 12 hr tablet Take 2 tablets (1,200 mg total) by mouth 2 (two) times  daily as needed for up to 5 days. 20 tablet Georgetta Haber, NP     Controlled Substance Prescriptions [place] Controlled Substance Registry consulted? Not Applicable   Georgetta Haber, NP 01/05/18 1528

## 2018-12-25 ENCOUNTER — Encounter (HOSPITAL_BASED_OUTPATIENT_CLINIC_OR_DEPARTMENT_OTHER): Payer: Self-pay | Admitting: *Deleted

## 2018-12-25 ENCOUNTER — Emergency Department (HOSPITAL_BASED_OUTPATIENT_CLINIC_OR_DEPARTMENT_OTHER)
Admission: EM | Admit: 2018-12-25 | Discharge: 2018-12-25 | Disposition: A | Discharge status: Home / Self Care | Payer: Self-pay | Attending: Emergency Medicine | Admitting: Emergency Medicine

## 2018-12-25 ENCOUNTER — Other Ambulatory Visit: Payer: Self-pay

## 2018-12-25 DIAGNOSIS — K029 Dental caries, unspecified: Secondary | ICD-10-CM | POA: Insufficient documentation

## 2018-12-25 MED ORDER — NAPROXEN 500 MG PO TABS: 500.0000 mg | ORAL_TABLET | Freq: Two times a day (BID) | ORAL | 0 refills | Status: DC

## 2018-12-25 MED ORDER — PENICILLIN V POTASSIUM 500 MG PO TABS: 500.0000 mg | ORAL_TABLET | Freq: Four times a day (QID) | ORAL | 0 refills | Status: AC

## 2018-12-25 MED FILL — NAPROXEN 500 MG TABLET: 500 | 10 days supply | Qty: 20 | Fill #0

## 2018-12-25 MED FILL — PENICILLIN VK 500 MG TABLET: 500 | 7 days supply | Qty: 28 | Fill #0

## 2018-12-25 NOTE — ED Provider Notes (Signed)
[place] EMERGENCY DEPARTMENT Provider Note   CSN: [id number] Arrival date & time: 12/25/18  1425     History   Chief Complaint Chief Complaint  Patient presents with  . Dental Pain    HPI Virginia Bruce is a 33 y.o. female presenting to the ED complaining of dental pain located left lower last molar. Pt states pain began 3 days ago. No assoc sx. Has taken OTC medications with relief of symptoms. Denies difficulty swallowing or breathing, fever, chills, N/V, or other symptoms today. She does not have a dentist.      The history is provided by the patient.    History reviewed. No pertinent past medical history.  There are no active problems to display for this patient.   History reviewed. No pertinent surgical history.   OB History   No obstetric history on file.      Home Medications    Prior to Admission medications   Medication Sig Start Date End Date Taking? Authorizing Provider  albuterol (PROAIR HFA) 108 (90 Base) MCG/ACT inhaler Inhale 1-2 puffs into the lungs every 6 (six) hours as needed for wheezing or shortness of breath. 01/05/18   Zigmund Gottron, NP  naproxen (NAPROSYN) 500 MG tablet Take 1 tablet (500 mg total) by mouth 2 (two) times daily with a meal. 12/25/18   , [place] N, PA-C  penicillin v potassium (VEETID) 500 MG tablet Take 1 tablet (500 mg total) by mouth 4 (four) times daily for 7 days. 12/25/18 01/01/19  , [place] N, PA-C    Family History No family history on file.  Social History Social History   Tobacco Use  . Smoking status: Never Smoker  . Smokeless tobacco: Never Used  Substance Use Topics  . Alcohol use: No  . Drug use: No     Allergies   Patient has no known allergies.   Review of Systems Review of Systems  Constitutional: Negative for fever.  HENT: Positive for dental problem.      Physical Exam Updated Vital Signs BP 124/68   Pulse 86   Temp 98.4 F (36.9 C) (Oral)   Resp 16   Ht 5'  8" (1.727 m)   Wt 113.4 kg   LMP 12/18/2018   SpO2 100%   BMI 38.01 kg/m   Physical Exam Vitals signs and nursing note reviewed.  Constitutional:      General: She is not in acute distress.    Appearance: She is well-developed.  HENT:     Head: Normocephalic and atraumatic.     Mouth/Throat:     Comments: Tolerating secretions.  Multiple dental caries present, including decay to the first left lower molar.  There is no gingival erythema or fluctuance.  Uvula is midline no trismus. Eyes:     Conjunctiva/sclera: Conjunctivae normal.  Neck:     Musculoskeletal: Normal range of motion and neck supple. No muscular tenderness.  Cardiovascular:     Rate and Rhythm: Normal rate and regular rhythm.  Pulmonary:     Effort: Pulmonary effort is normal.     Breath sounds: Normal breath sounds. No stridor.  Lymphadenopathy:     Cervical: No cervical adenopathy.  Neurological:     Mental Status: She is alert.  Psychiatric:        Mood and Affect: Mood normal.        Behavior: Behavior normal.      ED Treatments / Results  Labs (all labs ordered are listed, but only  abnormal results are displayed) Labs Reviewed - No data to display  EKG None  Radiology No results found.  Procedures Procedures (including critical care time)  Medications Ordered in ED Medications - No data to display   Initial Impression / Assessment and Plan / ED Course  I have reviewed the triage vital signs and the nursing notes.  Pertinent labs & imaging results that were available during my care of the patient were reviewed by me and considered in my medical decision making (see chart for details).        Patient with dental caries.  No gross abscess.  VSS, afebrile, tolerating secretions. Exam unconcerning for peritonsillar abscess, Ludwig's angina or spread of infection.  Will treat with penicillin and pain medicine.  Urged patient to follow-up with dentist. Pt safe for discharge.  Discussed  results, findings, treatment and follow up. Patient advised of return precautions. Patient verbalized understanding and agreed with plan.   Final Clinical Impressions(s) / ED Diagnoses   Final diagnoses:  Pain due to dental caries    ED Discharge Orders         Ordered    penicillin v potassium (VEETID) 500 MG tablet  4 times daily     12/25/18 1557    naproxen (NAPROSYN) 500 MG tablet  2 times daily with meals     12/25/18 1557           , [place] N, PA-C 12/25/18 1558    Terrilee FilesButler, Michael C, MD 12/25/18 2231

## 2018-12-25 NOTE — ED Triage Notes (Signed)
C/o dental pain x 3 days 

## 2018-12-25 NOTE — Discharge Instructions (Addendum)
Please read instructions below. °Take the antibiotic, Penicillin V, 4 times per day until they are gone. °You can take Naproxen up to 2 times per day with meals, as needed for pain. °Schedule an appointment with a dentist, using the dental resource guide attached. °Return to the ER for difficulty swallowing or breathing, fever, or new or worsening symptoms. ° °

## 2019-02-11 ENCOUNTER — Emergency Department (HOSPITAL_BASED_OUTPATIENT_CLINIC_OR_DEPARTMENT_OTHER)
Admission: EM | Admit: 2019-02-11 | Discharge: 2019-02-11 | Disposition: A | Discharge status: Home / Self Care | Payer: Self-pay | Attending: Emergency Medicine | Admitting: Emergency Medicine

## 2019-02-11 ENCOUNTER — Emergency Department (HOSPITAL_BASED_OUTPATIENT_CLINIC_OR_DEPARTMENT_OTHER): Payer: Self-pay

## 2019-02-11 ENCOUNTER — Encounter (HOSPITAL_BASED_OUTPATIENT_CLINIC_OR_DEPARTMENT_OTHER): Payer: Self-pay | Admitting: *Deleted

## 2019-02-11 ENCOUNTER — Other Ambulatory Visit: Payer: Self-pay

## 2019-02-11 DIAGNOSIS — Z791 Long term (current) use of non-steroidal anti-inflammatories (NSAID): Secondary | ICD-10-CM | POA: Insufficient documentation

## 2019-02-11 DIAGNOSIS — M25571 Pain in right ankle and joints of right foot: Secondary | ICD-10-CM | POA: Insufficient documentation

## 2019-02-11 NOTE — ED Provider Notes (Signed)
[place] EMERGENCY DEPARTMENT Provider Note   CSN: [id number] Arrival date & time: 02/11/19  1884     History   Chief Complaint Chief Complaint  Patient presents with   Ankle Injury    HPI Virginia Bruce is a 33 y.o. female who presents with right foot and ankle pain after missing a step 6 days ago.  Patient has been walking on it over the past several days.  It does hurt, however she is able to.  She has been taking ibuprofen, elevating, and using ice.  She reports she scraped her knee, but denies any other injuries.  Her tetanus is up-to-date.     HPI  History reviewed. No pertinent past medical history.  There are no active problems to display for this patient.   History reviewed. No pertinent surgical history.   OB History   No obstetric history on file.      Home Medications    Prior to Admission medications   Medication Sig Start Date End Date Taking? Authorizing Provider  albuterol (PROAIR HFA) 108 (90 Base) MCG/ACT inhaler Inhale 1-2 puffs into the lungs every 6 (six) hours as needed for wheezing or shortness of breath. 01/05/18   Zigmund Gottron, NP  naproxen (NAPROSYN) 500 MG tablet Take 1 tablet (500 mg total) by mouth 2 (two) times daily with a meal. 12/25/18   Robinson, [place] N, PA-C    Family History History reviewed. No pertinent family history.  Social History Social History   Tobacco Use   Smoking status: Never Smoker   Smokeless tobacco: Never Used  Substance Use Topics   Alcohol use: No   Drug use: No     Allergies   Patient has no known allergies.   Review of Systems Review of Systems  Constitutional: Negative for fever.  Musculoskeletal: Positive for arthralgias and joint swelling.  Neurological: Negative for numbness.     Physical Exam Updated Vital Signs BP 131/64    Pulse 76    Temp 98.9 F (37.2 C)    Resp 16    Ht 5\' 8"  (1.727 m)    Wt 121.1 kg    LMP 02/04/2019    SpO2 98%    BMI 40.60 kg/m    Physical Exam Vitals signs and nursing note reviewed.  Constitutional:      General: She is not in acute distress.    Appearance: She is well-developed. She is not diaphoretic.  HENT:     Head: Normocephalic and atraumatic.     Mouth/Throat:     Pharynx: No oropharyngeal exudate.  Eyes:     General: No scleral icterus.       Right eye: No discharge.        Left eye: No discharge.     Conjunctiva/sclera: Conjunctivae normal.     Pupils: Pupils are equal, round, and reactive to light.  Neck:     Musculoskeletal: Normal range of motion and neck supple.     Thyroid: No thyromegaly.  Cardiovascular:     Rate and Rhythm: Normal rate and regular rhythm.     Heart sounds: Normal heart sounds. No murmur. No friction rub. No gallop.   Pulmonary:     Effort: Pulmonary effort is normal. No respiratory distress.     Breath sounds: Normal breath sounds. No stridor. No wheezing or rales.  Abdominal:     General: Bowel sounds are normal. There is no distension.     Palpations: Abdomen is soft.  Tenderness: There is no abdominal tenderness. There is no guarding or rebound.  Musculoskeletal:     Right ankle: She exhibits decreased range of motion and swelling. She exhibits no ecchymosis, no deformity and normal pulse. Tenderness. Lateral malleolus tenderness found. No medial malleolus, no head of 5th metatarsal and no proximal fibula tenderness found. Achilles tendon normal.       Feet:  Lymphadenopathy:     Cervical: No cervical adenopathy.  Skin:    General: Skin is warm and dry.     Coloration: Skin is not pale.     Findings: No rash.  Neurological:     Mental Status: She is alert.     Coordination: Coordination normal.      ED Treatments / Results  Labs (all labs ordered are listed, but only abnormal results are displayed) Labs Reviewed - No data to display  EKG None  Radiology Dg Ankle Complete Right  Result Date: 02/11/2019 CLINICAL DATA:  33 year old female status  post fall 6 days ago with continued pain. EXAM: RIGHT ANKLE - COMPLETE 3+ VIEW COMPARISON:  None. FINDINGS: Bone mineralization is within normal limits. There is no evidence of fracture, dislocation, or joint effusion. There is no evidence of arthropathy. Lateral and anterior soft tissue swelling. Negative visible bones of the right foot. IMPRESSION: Soft tissue swelling.  No acute osseous abnormality identified. Electronically Signed   By: Odessa Fleming M.D.   On: 02/11/2019 19:36   Dg Foot Complete Right  Result Date: 02/11/2019 CLINICAL DATA:  Larey Seat off porch.  Ankle and foot pain. EXAM: RIGHT FOOT COMPLETE - 3+ VIEW COMPARISON:  Right ankle 02/11/2019 FINDINGS: Mild spurring in the calcaneus. Negative for a fracture or dislocation. Normal alignment of the right foot. No focal soft tissue abnormality. IMPRESSION: No acute bone abnormality. Electronically Signed   By: Richarda Overlie M.D.   On: 02/11/2019 20:41    Procedures Procedures (including critical care time)  Medications Ordered in ED Medications - No data to display   Initial Impression / Assessment and Plan / ED Course  I have reviewed the triage vital signs and the nursing notes.  Pertinent labs & imaging results that were available during my care of the patient were reviewed by me and considered in my medical decision making (see chart for details).        Patient X-Ray negative for obvious fracture or dislocation. Home Care discussed including rest and elevate the injured ankle, apply ice intermittently. Use crutches without weight bearing until able to comfortable bear partial weight, then progress to full weight bearing as tolerated. Splint applied. See ortho prn.  Patient will be dc home & is agreeable with above plan.   Final Clinical Impressions(s) / ED Diagnoses   Final diagnoses:  Acute right ankle pain    ED Discharge Orders    None       Emi Holes, PA-C 02/11/19 2154    Melene Plan, DO 02/11/19 2255

## 2019-02-11 NOTE — ED Triage Notes (Signed)
Pt c/o  right ankle injury x 6 days

## 2019-02-11 NOTE — Discharge Instructions (Signed)
Medications: Ibuprofen, Tylenol  Treatment: Take ibuprofen every 6 hours (or Aleve every 12 hours) to help with pain and swelling. You can alternate with Tylenol for breakthrough pain 4-6 hours after taking ibuprofen. Use ice 3-4 times daily alternating 20 minutes on, 20 minutes off. Keep your leg elevated whenever you're not walking on it. Wear your splint at all times except when bathing. Use crutches at all times initially and begin partial weightbearing as tolerated.  Follow-up: Please follow-up with the orthopedic doctor if your symptoms are not improving over the next 7-10 days. Please return to the emergency department if you develop any new or worsening symptoms.

## 2019-03-10 ENCOUNTER — Other Ambulatory Visit: Payer: Self-pay

## 2019-03-10 DIAGNOSIS — Z20822 Contact with and (suspected) exposure to covid-19: Secondary | ICD-10-CM

## 2019-03-11 LAB — NOVEL CORONAVIRUS, NAA: SARS-CoV-2, NAA: NOT DETECTED

## 2019-04-07 ENCOUNTER — Encounter (HOSPITAL_BASED_OUTPATIENT_CLINIC_OR_DEPARTMENT_OTHER): Payer: Self-pay | Admitting: *Deleted

## 2019-04-07 ENCOUNTER — Emergency Department (HOSPITAL_BASED_OUTPATIENT_CLINIC_OR_DEPARTMENT_OTHER)
Admission: EM | Admit: 2019-04-07 | Discharge: 2019-04-07 | Disposition: A | Discharge status: Home / Self Care | Payer: Self-pay | Attending: Emergency Medicine | Admitting: Emergency Medicine

## 2019-04-07 ENCOUNTER — Other Ambulatory Visit: Payer: Self-pay

## 2019-04-07 DIAGNOSIS — N760 Acute vaginitis: Secondary | ICD-10-CM | POA: Insufficient documentation

## 2019-04-07 DIAGNOSIS — B9689 Other specified bacterial agents as the cause of diseases classified elsewhere: Secondary | ICD-10-CM

## 2019-04-07 DIAGNOSIS — Z202 Contact with and (suspected) exposure to infections with a predominantly sexual mode of transmission: Secondary | ICD-10-CM

## 2019-04-07 LAB — WET PREP, GENITAL
Sperm: NONE SEEN
Trich, Wet Prep: NONE SEEN
Yeast Wet Prep HPF POC: NONE SEEN

## 2019-04-07 LAB — URINALYSIS, ROUTINE W REFLEX MICROSCOPIC
Bilirubin Urine: NEGATIVE
Glucose, UA: NEGATIVE mg/dL
Ketones, ur: 15 mg/dL — AB
Leukocytes,Ua: NEGATIVE
Nitrite: NEGATIVE
Protein, ur: NEGATIVE mg/dL
Specific Gravity, Urine: 1.025 (ref 1.005–1.030)
pH: 6 (ref 5.0–8.0)

## 2019-04-07 LAB — URINALYSIS, MICROSCOPIC (REFLEX): WBC, UA: NONE SEEN WBC/hpf (ref 0–5)

## 2019-04-07 LAB — PREGNANCY, URINE: Preg Test, Ur: NEGATIVE

## 2019-04-07 LAB — HIV ANTIBODY (ROUTINE TESTING W REFLEX): HIV Screen 4th Generation wRfx: NONREACTIVE

## 2019-04-07 MED ORDER — AZITHROMYCIN 250 MG PO TABS
1000.0000 mg | ORAL_TABLET | Freq: Once | ORAL | Status: AC
  Administered 2019-04-07: 1000 mg via ORAL
  Filled 2019-04-07: qty 4

## 2019-04-07 MED ORDER — METRONIDAZOLE 500 MG PO TABS: 500.0000 mg | ORAL_TABLET | Freq: Two times a day (BID) | ORAL | 0 refills | Status: AC

## 2019-04-07 MED ORDER — CEFTRIAXONE SODIUM 250 MG IJ SOLR
250.0000 mg | Freq: Once | INTRAMUSCULAR | Status: AC
  Administered 2019-04-07: 250 mg via INTRAMUSCULAR
  Filled 2019-04-07: qty 250

## 2019-04-07 NOTE — ED Triage Notes (Signed)
Pt requesting std check, denies symptoms
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
## 2019-04-07 NOTE — Discharge Instructions (Signed)
I have provided antibiotics to treat your BV, please take 1 twice a day for the next 7 days. Please be aware, this medication can make you sick if combined with alcohol.   Please follow up with PCP as needed.

## 2019-04-08 LAB — RPR: RPR Ser Ql: NONREACTIVE

## 2019-04-09 LAB — GC/CHLAMYDIA PROBE AMP (~~LOC~~) NOT AT ARMC
Chlamydia: NEGATIVE
Neisseria Gonorrhea: NEGATIVE

## 2020-07-28 ENCOUNTER — Other Ambulatory Visit: Payer: Self-pay

## 2020-07-28 ENCOUNTER — Encounter (HOSPITAL_BASED_OUTPATIENT_CLINIC_OR_DEPARTMENT_OTHER): Payer: Self-pay | Admitting: Emergency Medicine

## 2020-07-28 ENCOUNTER — Emergency Department (HOSPITAL_BASED_OUTPATIENT_CLINIC_OR_DEPARTMENT_OTHER)
Admission: EM | Admit: 2020-07-28 | Discharge: 2020-07-28 | Disposition: A | Discharge status: Home / Self Care | Payer: Self-pay | Attending: Emergency Medicine | Admitting: Emergency Medicine

## 2020-07-28 DIAGNOSIS — R1013 Epigastric pain: Secondary | ICD-10-CM | POA: Insufficient documentation

## 2020-07-28 DIAGNOSIS — R112 Nausea with vomiting, unspecified: Secondary | ICD-10-CM | POA: Insufficient documentation

## 2020-07-28 DIAGNOSIS — M549 Dorsalgia, unspecified: Secondary | ICD-10-CM | POA: Insufficient documentation

## 2020-07-28 LAB — CBC WITH DIFFERENTIAL/PLATELET
Abs Immature Granulocytes: 0.02 10*3/uL (ref 0.00–0.07)
Basophils Absolute: 0 10*3/uL (ref 0.0–0.1)
Basophils Relative: 0 %
Eosinophils Absolute: 0.2 10*3/uL (ref 0.0–0.5)
Eosinophils Relative: 3 %
HCT: 28.9 % — ABNORMAL LOW (ref 36.0–46.0)
Hemoglobin: 8.6 g/dL — ABNORMAL LOW (ref 12.0–15.0)
Immature Granulocytes: 0 %
Lymphocytes Relative: 22 %
Lymphs Abs: 1.5 10*3/uL (ref 0.7–4.0)
MCH: 24.5 pg — ABNORMAL LOW (ref 26.0–34.0)
MCHC: 29.8 g/dL — ABNORMAL LOW (ref 30.0–36.0)
MCV: 82.3 fL (ref 80.0–100.0)
Monocytes Absolute: 0.3 10*3/uL (ref 0.1–1.0)
Monocytes Relative: 5 %
Neutro Abs: 4.9 10*3/uL (ref 1.7–7.7)
Neutrophils Relative %: 70 %
Platelets: 365 10*3/uL (ref 150–400)
RBC: 3.51 MIL/uL — ABNORMAL LOW (ref 3.87–5.11)
RDW: 15.6 % — ABNORMAL HIGH (ref 11.5–15.5)
WBC: 7 10*3/uL (ref 4.0–10.5)
nRBC: 0 % (ref 0.0–0.2)

## 2020-07-28 LAB — PREGNANCY, URINE: Preg Test, Ur: NEGATIVE

## 2020-07-28 LAB — URINALYSIS, ROUTINE W REFLEX MICROSCOPIC
Bilirubin Urine: NEGATIVE
Glucose, UA: NEGATIVE mg/dL
Hgb urine dipstick: NEGATIVE
Ketones, ur: NEGATIVE mg/dL
Leukocytes,Ua: NEGATIVE
Nitrite: NEGATIVE
Protein, ur: NEGATIVE mg/dL
Specific Gravity, Urine: 1.025 (ref 1.005–1.030)
pH: 6 (ref 5.0–8.0)

## 2020-07-28 LAB — COMPREHENSIVE METABOLIC PANEL
ALT: 11 U/L (ref 0–44)
AST: 16 U/L (ref 15–41)
Albumin: 3.5 g/dL (ref 3.5–5.0)
Alkaline Phosphatase: 56 U/L (ref 38–126)
Anion gap: 7 (ref 5–15)
BUN: 11 mg/dL (ref 6–20)
CO2: 21 mmol/L — ABNORMAL LOW (ref 22–32)
Calcium: 8.5 mg/dL — ABNORMAL LOW (ref 8.9–10.3)
Chloride: 109 mmol/L (ref 98–111)
Creatinine, Ser: 0.6 mg/dL (ref 0.44–1.00)
GFR, Estimated: >60 mL/min (ref >60)
Glucose, Bld: 93 mg/dL (ref 70–99)
Potassium: 3.6 mmol/L (ref 3.5–5.1)
Sodium: 137 mmol/L (ref 135–145)
Total Bilirubin: 0.3 mg/dL (ref 0.3–1.2)
Total Protein: 6.8 g/dL (ref 6.5–8.1)

## 2020-07-28 LAB — LIPASE, BLOOD: Lipase: 33 U/L (ref 11–51)

## 2020-07-28 MED ORDER — PANTOPRAZOLE SODIUM 40 MG PO TBEC
40.0000 mg | DELAYED_RELEASE_TABLET | Freq: Once | ORAL | Status: AC
  Administered 2020-07-28: 40 mg via ORAL
  Filled 2020-07-28: qty 1

## 2020-07-28 MED ORDER — ALUM & MAG HYDROXIDE-SIMETH 200-200-20 MG/5ML PO SUSP
30.0000 mL | Freq: Once | ORAL | Status: AC
  Administered 2020-07-28: 30 mL via ORAL
  Filled 2020-07-28: qty 30

## 2020-07-28 MED ORDER — ONDANSETRON 4 MG PO TBDP
4.0000 mg | ORAL_TABLET | Freq: Once | ORAL | Status: AC
  Administered 2020-07-28: 4 mg via ORAL
  Filled 2020-07-28: qty 1

## 2020-07-28 MED ORDER — OMEPRAZOLE 20 MG PO CPDR: 20.0000 mg | DELAYED_RELEASE_CAPSULE | Freq: Every day | ORAL | 0 refills | Status: AC

## 2020-07-28 NOTE — Discharge Instructions (Signed)
You were seen today for abdominal pain.  This may be related to reflux.  Your lab work is reassuring.  Start omeprazole daily.

## 2020-07-28 NOTE — ED Provider Notes (Signed)
[facility] EMERGENCY DEPARTMENT Provider Note   CSN: [id number] Arrival date & time: 07/28/20  0255     History No chief complaint on file.   Virginia Bruce is a 35 y.o. female.  HPI     This is a 35 year old female with no reported past medical history who presents with abdominal pain.  Patient reports over the last week she has had increasingly worsening epigastric pain.  Sometimes it radiates to the back.  It is worse with some food intake and at night when she lies flat.  She has tried multiple over-the-counter medications with no relief.  She not had any fevers.  No urinary symptoms.  Reports nausea and vomiting.  No diarrhea.  No known sick contacts.  Patient states that she has had similar pain in the past but over the last week it has worsened significantly.  Currently she rates her pain 8 out of 10.  History reviewed. No pertinent past medical history.  There are no problems to display for this patient.   History reviewed. No pertinent surgical history.   OB History   No obstetric history on file.     No family history on file.  Social History   Tobacco Use  . Smoking status: Never Smoker  . Smokeless tobacco: Never Used  Substance Use Topics  . Alcohol use: No  . Drug use: No    Home Medications Prior to Admission medications   Medication Sig Start Date End Date Taking? Authorizing Provider  omeprazole (PRILOSEC) 20 MG capsule Take 1 capsule (20 mg total) by mouth daily. 07/28/20  Yes Deno Sida, Mayer Masker, MD  albuterol (PROAIR HFA) 108 (90 Base) MCG/ACT inhaler Inhale 1-2 puffs into the lungs every 6 (six) hours as needed for wheezing or shortness of breath. 01/05/18   Georgetta Haber, NP  naproxen (NAPROSYN) 500 MG tablet Take 1 tablet (500 mg total) by mouth 2 (two) times daily with a meal. 12/25/18   Robinson, [place] N, PA-C    Allergies    Patient has no known allergies.  Review of Systems   Review of Systems  Constitutional: Negative for  fever.  Cardiovascular: Negative for chest pain.  Gastrointestinal: Positive for abdominal pain, nausea and vomiting. Negative for diarrhea.  Genitourinary: Negative for dysuria.  All other systems reviewed and are negative.   Physical Exam Updated Vital Signs BP (!) 106/46 (BP Location: Right Arm)   Pulse 70   Temp 98.4 F (36.9 C) (Oral)   Resp 18   Ht 1.727 m (5\' 8" )   Wt 113.4 kg   LMP 07/14/2020 (Approximate)   SpO2 100%   BMI 38.01 kg/m   Physical Exam Vitals and nursing note reviewed.  Constitutional:      Appearance: She is well-developed. She is obese. She is not ill-appearing.  HENT:     Head: Normocephalic and atraumatic.  Eyes:     Pupils: Pupils are equal, round, and reactive to light.  Cardiovascular:     Rate and Rhythm: Normal rate and regular rhythm.     Heart sounds: Normal heart sounds.  Pulmonary:     Effort: Pulmonary effort is normal. No respiratory distress.     Breath sounds: No wheezing.  Abdominal:     General: Bowel sounds are normal.     Palpations: Abdomen is soft.     Tenderness: There is abdominal tenderness. There is no guarding or rebound.     Comments: Epigastric tenderness palpation, no rebound or guard  Musculoskeletal:     Cervical back: Neck supple.     Right lower leg: No edema.     Left lower leg: No edema.  Skin:    General: Skin is warm and dry.  Neurological:     Mental Status: She is alert and oriented to person, place, and time.  Psychiatric:        Mood and Affect: Mood normal.     ED Results / Procedures / Treatments   Labs (all labs ordered are listed, but only abnormal results are displayed) Labs Reviewed  CBC WITH DIFFERENTIAL/PLATELET - Abnormal; Notable for the following components:      Result Value   RBC 3.51 (*)    Hemoglobin 8.6 (*)    HCT 28.9 (*)    MCH 24.5 (*)    MCHC 29.8 (*)    RDW 15.6 (*)    All other components within normal limits  COMPREHENSIVE METABOLIC PANEL - Abnormal; Notable for  the following components:   CO2 21 (*)    Calcium 8.5 (*)    All other components within normal limits  URINALYSIS, ROUTINE W REFLEX MICROSCOPIC  PREGNANCY, URINE  LIPASE, BLOOD  CBC WITH DIFFERENTIAL/PLATELET    EKG None  Radiology No results found.  Procedures Procedures   Medications Ordered in ED Medications  ondansetron (ZOFRAN-ODT) disintegrating tablet 4 mg (4 mg Oral Given 07/28/20 0330)  alum & mag hydroxide-simeth (MAALOX/MYLANTA) 200-200-20 MG/5ML suspension 30 mL (30 mLs Oral Given 07/28/20 0341)  pantoprazole (PROTONIX) EC tablet 40 mg (40 mg Oral Given 07/28/20 0331)    ED Course  I have reviewed the triage vital signs and the nursing notes.  Pertinent labs & imaging results that were available during my care of the patient were reviewed by me and considered in my medical decision making (see chart for details).    MDM Rules/Calculators/A&P                          Patient presents with epigastric pain.  She is overall nontoxic and vital signs are reassuring.  She has epigastric tenderness without signs of peritonitis.  Considerations include but not limited to, gastritis, reflux, pancreatitis, cholecystitis.  Labs obtained.  Patient given a GI cocktail and omeprazole.  Lab work reviewed.  It is largely reassuring.  No significant LFT derangements.  Lipase is normal which argues against pancreatitis.  Given location of pain and nature of symptoms, highly suspicious for reflux.  Will start on omeprazole daily.  After history, exam, and medical workup I feel the patient has been appropriately medically screened and is safe for discharge home. Pertinent diagnoses were discussed with the patient. Patient was given return precautions.  Final Clinical Impression(s) / ED Diagnoses Final diagnoses:  Epigastric pain    Rx / DC Orders ED Discharge Orders         Ordered    omeprazole (PRILOSEC) 20 MG capsule  Daily        07/28/20 0416           Shon Baton, MD 07/28/20 [phone]

## 2020-07-28 NOTE — ED Triage Notes (Signed)
Pt c/o mid abd pain radiating to back with n/v x 1 week.

## 2020-11-26 ENCOUNTER — Encounter (HOSPITAL_BASED_OUTPATIENT_CLINIC_OR_DEPARTMENT_OTHER): Payer: Self-pay | Admitting: *Deleted

## 2020-11-26 ENCOUNTER — Emergency Department (HOSPITAL_BASED_OUTPATIENT_CLINIC_OR_DEPARTMENT_OTHER)
Admission: EM | Admit: 2020-11-26 | Discharge: 2020-11-26 | Disposition: A | Discharge status: Home / Self Care | Payer: Self-pay | Attending: Emergency Medicine | Admitting: Emergency Medicine

## 2020-11-26 ENCOUNTER — Other Ambulatory Visit: Payer: Self-pay

## 2020-11-26 DIAGNOSIS — R079 Chest pain, unspecified: Secondary | ICD-10-CM | POA: Insufficient documentation

## 2020-11-26 DIAGNOSIS — G44219 Episodic tension-type headache, not intractable: Secondary | ICD-10-CM | POA: Insufficient documentation

## 2020-11-26 DIAGNOSIS — R059 Cough, unspecified: Secondary | ICD-10-CM | POA: Insufficient documentation

## 2020-11-26 DIAGNOSIS — J3489 Other specified disorders of nose and nasal sinuses: Secondary | ICD-10-CM | POA: Insufficient documentation

## 2020-11-26 DIAGNOSIS — Z20822 Contact with and (suspected) exposure to covid-19: Secondary | ICD-10-CM | POA: Insufficient documentation

## 2020-11-26 DIAGNOSIS — J029 Acute pharyngitis, unspecified: Secondary | ICD-10-CM | POA: Insufficient documentation

## 2020-11-26 LAB — SARS CORONAVIRUS 2 (TAT 6-24 HRS): SARS Coronavirus 2: NEGATIVE

## 2020-11-26 MED ORDER — ACETAMINOPHEN 500 MG PO TABS
1000.0000 mg | ORAL_TABLET | Freq: Once | ORAL | Status: AC
  Administered 2020-11-26: 1000 mg via ORAL
  Filled 2020-11-26: qty 2

## 2020-11-26 MED ORDER — KETOROLAC TROMETHAMINE 60 MG/2ML IM SOLN
30.0000 mg | Freq: Once | INTRAMUSCULAR | Status: AC
  Administered 2020-11-26: 30 mg via INTRAMUSCULAR
  Filled 2020-11-26: qty 2

## 2020-11-26 NOTE — ED Provider Notes (Signed)
[facility] EMERGENCY DEPARTMENT Provider Note  CSN: [id number] Arrival date & time: 11/26/20 0054  Chief Complaint(s) Cough  HPI Virginia Bruce is a 35 y.o. female    Cough Cough characteristics:  Non-productive Severity:  Moderate Onset quality:  Gradual Duration:  3 days Timing:  Intermittent Progression:  Waxing and waning Chronicity:  New Smoker: no   Context: not sick contacts   Relieved by:  None tried Worsened by:  Nothing Associated symptoms: chest pain (only with cough), headaches, myalgias, rhinorrhea and sore throat   Associated symptoms: no fever and no wheezing    Past Medical History History reviewed. No pertinent past medical history. There are no problems to display for this patient.  Home Medication(s) Prior to Admission medications   Medication Sig Start Date End Date Taking? Authorizing Provider  albuterol (PROAIR HFA) 108 (90 Base) MCG/ACT inhaler Inhale 1-2 puffs into the lungs every 6 (six) hours as needed for wheezing or shortness of breath. 01/05/18   Georgetta Haber, NP  naproxen (NAPROSYN) 500 MG tablet Take 1 tablet (500 mg total) by mouth 2 (two) times daily with a meal. 12/25/18   Robinson, [place] N, PA-C  omeprazole (PRILOSEC) 20 MG capsule Take 1 capsule (20 mg total) by mouth daily. 07/28/20   Horton, Mayer Masker, MD                                                                                                                                    Past Surgical History History reviewed. No pertinent surgical history. Family History No family history on file.  Social History Social History   Tobacco Use   Smoking status: Never   Smokeless tobacco: Never  Substance Use Topics   Alcohol use: No   Drug use: No   Allergies Patient has no known allergies.  Review of Systems Review of Systems  Constitutional:  Negative for fever.  HENT:  Positive for rhinorrhea and sore throat.   Respiratory:  Positive for cough. Negative for  wheezing.   Cardiovascular:  Positive for chest pain (only with cough).  Musculoskeletal:  Positive for myalgias.  Neurological:  Positive for headaches.  All other systems are reviewed and are negative for acute change except as noted in the HPI  Physical Exam Vital Signs  I have reviewed the triage vital signs BP 122/65   Pulse 85   Temp 98 F (36.7 C) (Oral)   Resp 16   Ht 5\' 8"  (1.727 m)   Wt 113.4 kg   LMP 11/01/2020   SpO2 99%   BMI 38.01 kg/m   Physical Exam Vitals reviewed.  Constitutional:      General: She is not in acute distress.    Appearance: She is well-developed. She is not diaphoretic.  HENT:     Head: Normocephalic and atraumatic.     Right Ear: Tympanic membrane normal.     Left Ear: Tympanic membrane  normal.     Nose: Nose normal.     Mouth/Throat:     Pharynx: Oropharynx is clear.  Eyes:     General: No scleral icterus.       Right eye: No discharge.        Left eye: No discharge.     Conjunctiva/sclera: Conjunctivae normal.     Pupils: Pupils are equal, round, and reactive to light.  Cardiovascular:     Rate and Rhythm: Normal rate and regular rhythm.     Heart sounds: No murmur heard.   No friction rub. No gallop.  Pulmonary:     Effort: Pulmonary effort is normal. No respiratory distress.     Breath sounds: Normal breath sounds. No stridor. No rales.  Abdominal:     General: There is no distension.     Palpations: Abdomen is soft.     Tenderness: There is no abdominal tenderness.  Musculoskeletal:        General: No tenderness.     Cervical back: Normal range of motion and neck supple.  Skin:    General: Skin is warm and dry.     Findings: No erythema or rash.  Neurological:     Mental Status: She is alert and oriented to person, place, and time.    ED Results and Treatments Labs (all labs ordered are listed, but only abnormal results are displayed) Labs Reviewed  SARS CORONAVIRUS 2 (TAT 6-24 HRS)                                                                                                                          EKG  EKG Interpretation  Date/Time:    Ventricular Rate:    PR Interval:    QRS Duration:   QT Interval:    QTC Calculation:   R Axis:     Text Interpretation:         Radiology No results found.  Pertinent labs & imaging results that were available during my care of the patient were reviewed by me and considered in my medical decision making (see chart for details).  Medications Ordered in ED Medications  acetaminophen (TYLENOL) tablet 1,000 mg (has no administration in time range)  ketorolac (TORADOL) injection 30 mg (has no administration in time range)  Procedures Procedures  (including critical care time)  Medical Decision Making / ED Course I have reviewed the nursing notes for this encounter and the patient's prior records (if available in EHR or on provided paperwork).   Virginia Bruce was evaluated in Emergency Department on 11/26/2020 for the symptoms described in the history of present illness. She was evaluated in the context of the global COVID-19 pandemic, which necessitated consideration that the patient might be at risk for infection with the SARS-CoV-2 virus that causes COVID-19. Institutional protocols and algorithms that pertain to the evaluation of patients at risk for COVID-19 are in a state of rapid change based on information released by regulatory bodies including the CDC and federal and state organizations. These policies and algorithms were followed during the patient's care in the ED.  Patient presents with viral symptoms for 3 days. adequate oral hydration. Rest of history as above.  Generalized headache. Non focal neuro exam. No recent head trauma. No fever. Doubt meningitis. Doubt intracranial bleed. Doubt IIH. No indication for  imaging.   Patient appears well. No signs of toxicity, patient is interactive. No hypoxia, tachypnea or other signs of respiratory distress. No sign of clinical dehydration. Lung exam clear. Rest of exam as above.  Most consistent with viral illness   No evidence suggestive of pharyngitis, AOM, PNA, or meningitis.  Chest x-ray not indicated at this time.  COVID/Flu test obtained.  Discussed symptomatic treatment with the patient and they will follow closely with their PCP.        Final Clinical Impression(s) / ED Diagnoses Final diagnoses:  Cough  Episodic tension-type headache, not intractable   The patient appears reasonably screened and/or stabilized for discharge and I doubt any other medical condition or other [facility] requiring further screening, evaluation, or treatment in the ED at this time prior to discharge. Safe for discharge with strict return precautions.  Disposition: Discharge  Condition: Good  I have discussed the results, Dx and Tx plan with the patient/family who expressed understanding and agree(s) with the plan. Discharge instructions discussed at length. The patient/family was given strict return precautions who verbalized understanding of the instructions. No further questions at time of discharge.    ED Discharge Orders     None       Follow Up: Primary care provider  Call  as needed      This chart was dictated using voice recognition software.  Despite best efforts to proofread,  errors can occur which can change the documentation meaning.    Nira Conn, MD 11/26/20 Emeline Darling

## 2020-11-26 NOTE — ED Triage Notes (Signed)
C/o cough , sore throat , sob , h/a x 3 days
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
## 2023-08-20 ENCOUNTER — Emergency Department (HOSPITAL_BASED_OUTPATIENT_CLINIC_OR_DEPARTMENT_OTHER): Payer: Self-pay

## 2023-08-20 ENCOUNTER — Encounter (HOSPITAL_BASED_OUTPATIENT_CLINIC_OR_DEPARTMENT_OTHER): Payer: Self-pay | Admitting: Emergency Medicine

## 2023-08-20 ENCOUNTER — Emergency Department (HOSPITAL_BASED_OUTPATIENT_CLINIC_OR_DEPARTMENT_OTHER)
Admission: EM | Admit: 2023-08-20 | Discharge: 2023-08-20 | Discharge status: Against Medical Advice | Payer: Self-pay | Attending: Emergency Medicine | Admitting: Emergency Medicine

## 2023-08-20 ENCOUNTER — Other Ambulatory Visit: Payer: Self-pay

## 2023-08-20 DIAGNOSIS — R5383 Other fatigue: Secondary | ICD-10-CM | POA: Insufficient documentation

## 2023-08-20 DIAGNOSIS — Z5321 Procedure and treatment not carried out due to patient leaving prior to being seen by health care provider: Secondary | ICD-10-CM | POA: Insufficient documentation

## 2023-08-20 DIAGNOSIS — F17210 Nicotine dependence, cigarettes, uncomplicated: Secondary | ICD-10-CM | POA: Insufficient documentation

## 2023-08-20 DIAGNOSIS — R059 Cough, unspecified: Secondary | ICD-10-CM | POA: Insufficient documentation

## 2023-08-20 LAB — URINALYSIS, ROUTINE W REFLEX MICROSCOPIC
Glucose, UA: NEGATIVE mg/dL
Ketones, ur: 15 mg/dL — AB
Leukocytes,Ua: NEGATIVE
Nitrite: NEGATIVE
Protein, ur: 100 mg/dL — AB
Specific Gravity, Urine: >=1.03 (ref 1.005–1.030)
pH: 6 (ref 5.0–8.0)

## 2023-08-20 LAB — COMPREHENSIVE METABOLIC PANEL WITH GFR
ALT: 11 U/L (ref 0–44)
AST: 18 U/L (ref 15–41)
Albumin: 4.5 g/dL (ref 3.5–5.0)
Alkaline Phosphatase: 97 U/L (ref 38–126)
Anion gap: 17 — ABNORMAL HIGH (ref 5–15)
BUN: 8 mg/dL (ref 6–20)
CO2: 20 mmol/L — ABNORMAL LOW (ref 22–32)
Calcium: 9.5 mg/dL (ref 8.9–10.3)
Chloride: 104 mmol/L (ref 98–111)
Creatinine, Ser: 0.8 mg/dL (ref 0.44–1.00)
GFR, Estimated: >60 mL/min (ref >60)
Glucose, Bld: 90 mg/dL (ref 70–99)
Potassium: 3.3 mmol/L — ABNORMAL LOW (ref 3.5–5.1)
Sodium: 140 mmol/L (ref 135–145)
Total Bilirubin: 0.7 mg/dL (ref 0.0–1.2)
Total Protein: 8.1 g/dL (ref 6.5–8.1)

## 2023-08-20 LAB — CBC
HCT: 32.8 % — ABNORMAL LOW (ref 36.0–46.0)
Hemoglobin: 10.4 g/dL — ABNORMAL LOW (ref 12.0–15.0)
MCH: 26.7 pg (ref 26.0–34.0)
MCHC: 31.7 g/dL (ref 30.0–36.0)
MCV: 84.3 fL (ref 80.0–100.0)
Platelets: 377 10*3/uL (ref 150–400)
RBC: 3.89 MIL/uL (ref 3.87–5.11)
RDW: 14.5 % (ref 11.5–15.5)
WBC: 7.6 10*3/uL (ref 4.0–10.5)
nRBC: 0 % (ref 0.0–0.2)

## 2023-08-20 LAB — PREGNANCY, URINE: Preg Test, Ur: NEGATIVE

## 2023-08-20 LAB — URINALYSIS, MICROSCOPIC (REFLEX): WBC, UA: NONE SEEN WBC/hpf (ref 0–5)

## 2023-08-20 NOTE — ED Triage Notes (Signed)
 Pt tearful at time of triage.  States it is hard to explain what she is feeling but overall just fatigued for possibly years.  States she has had a dry cough and had a "clot" in her sputum this morning.  Pt states she has not had a physical in years and is worried something may be wrong.  Pt does report smoking 2 cigarettes per day.

## 2024-01-04 ENCOUNTER — Emergency Department (HOSPITAL_COMMUNITY)

## 2024-01-04 ENCOUNTER — Emergency Department (HOSPITAL_COMMUNITY)
Admission: EM | Admit: 2024-01-04 | Discharge: 2024-01-04 | Disposition: A | Discharge status: Home / Self Care | Attending: Emergency Medicine | Admitting: Emergency Medicine

## 2024-01-04 ENCOUNTER — Encounter (HOSPITAL_COMMUNITY): Payer: Self-pay | Admitting: *Deleted

## 2024-01-04 ENCOUNTER — Other Ambulatory Visit: Payer: Self-pay

## 2024-01-04 DIAGNOSIS — Y92410 Unspecified street and highway as the place of occurrence of the external cause: Secondary | ICD-10-CM | POA: Insufficient documentation

## 2024-01-04 DIAGNOSIS — M25511 Pain in right shoulder: Secondary | ICD-10-CM | POA: Diagnosis present

## 2024-01-04 MED ORDER — NAPROXEN 500 MG PO TABS: 500.0000 mg | ORAL_TABLET | Freq: Two times a day (BID) | ORAL | 0 refills | Status: AC

## 2024-01-04 MED ORDER — ACETAMINOPHEN 325 MG PO TABS
650.0000 mg | ORAL_TABLET | Freq: Once | ORAL | Status: AC
  Administered 2024-01-04: 650 mg via ORAL
  Filled 2024-01-04: qty 2
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
## 2024-01-04 NOTE — Progress Notes (Signed)
 Orthopedic Tech Progress Note Patient Details:  Virginia Bruce 1985-10-05 [id number]  Ortho Devices Type of Ortho Device: Arm sling Ortho Device/Splint Location: RUE Ortho Device/Splint Interventions: Application   Post Interventions Patient Tolerated: Well  Massie BRAVO Savva Beamer 01/04/2024, 3:19 PM

## 2024-01-04 NOTE — Discharge Instructions (Addendum)
 It was a pleasure taking care of you today.  Based on your history, physical exam, and imaging I feel you are safe for discharge.  Today the x-rays of your right shoulder were negative for acute fracture or dislocation.  Based off of your physical exam I am suspicious for a possible rotator cuff injury.  Because of this you have been prescribed an anti-inflammatory medication to help with pain and inflammation, you have also requested a sling for comfort.  You have been given the information for a orthopedic doctor, please call as soon as possible and make an appointment for ongoing diagnosis and treatment.  Please do not take any over-the-counter ibuprofen while taking your prescribed anti-inflammatory medication.  You may however take Tylenol , please keep in mind that the max daily dose of Tylenol  is 4000 mg/day.  Also recommend rest and ice to help with swelling.  If you experience any of the following symptoms including but not limited to fever, chills, chest pain, shortness of breath, visual disturbances, severe headache, severe neck pain, unexplained weakness, worsening shoulder pain, or other concerning symptom please return to the emergency department or seek further medical care.  Recommend establishing with a primary care provider as soon as possible.
